# Patient Record
Sex: Female | Born: 1955 | Smoking: Never smoker
Health system: Southern US, Community
[De-identification: ages and names within clinical notes are randomized; demographics above are authoritative.]

## PROBLEM LIST (undated history)

## (undated) DIAGNOSIS — M40204 Unspecified kyphosis, thoracic region: Secondary | ICD-10-CM

## (undated) DIAGNOSIS — I10 Essential (primary) hypertension: Secondary | ICD-10-CM

## (undated) HISTORY — DX: Unspecified kyphosis, thoracic region: M40.204

## (undated) HISTORY — DX: Essential (primary) hypertension: I10

---
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
## 1993-08-12 HISTORY — PX: LUMBAR DISC SURGERY: SHX700

## 2020-05-01 ENCOUNTER — Other Ambulatory Visit
Admission: RE | Admit: 2020-05-01 | Discharge: 2020-05-01 | Disposition: A | Discharge status: Home / Self Care | Payer: Self-pay | Source: Ambulatory Visit | Attending: Internal Medicine | Admitting: Internal Medicine

## 2020-05-01 DIAGNOSIS — R002 Palpitations: Secondary | ICD-10-CM | POA: Insufficient documentation

## 2020-05-01 LAB — FIBRIN DERIVATIVES D-DIMER (ARMC ONLY): Fibrin derivatives D-dimer (ARMC): 610.28 ng/mL (FEU) — ABNORMAL HIGH (ref 0.00–499.00)

## 2020-05-01 LAB — BRAIN NATRIURETIC PEPTIDE: B Natriuretic Peptide: 53.2 pg/mL (ref 0.0–100.0)

## 2020-05-16 ENCOUNTER — Other Ambulatory Visit: Payer: Self-pay | Admitting: Internal Medicine

## 2020-05-16 DIAGNOSIS — Z1231 Encounter for screening mammogram for malignant neoplasm of breast: Secondary | ICD-10-CM

## 2020-05-19 ENCOUNTER — Other Ambulatory Visit: Payer: Self-pay | Admitting: Internal Medicine

## 2020-05-19 DIAGNOSIS — Z0001 Encounter for general adult medical examination with abnormal findings: Secondary | ICD-10-CM

## 2020-05-19 DIAGNOSIS — R937 Abnormal findings on diagnostic imaging of other parts of musculoskeletal system: Secondary | ICD-10-CM

## 2020-05-19 DIAGNOSIS — M5430 Sciatica, unspecified side: Secondary | ICD-10-CM

## 2020-05-19 DIAGNOSIS — R7989 Other specified abnormal findings of blood chemistry: Secondary | ICD-10-CM

## 2020-05-19 DIAGNOSIS — M549 Dorsalgia, unspecified: Secondary | ICD-10-CM

## 2020-05-23 ENCOUNTER — Other Ambulatory Visit: Payer: Self-pay

## 2020-05-23 ENCOUNTER — Ambulatory Visit
Admission: RE | Admit: 2020-05-23 | Discharge: 2020-05-23 | Disposition: A | Discharge status: Home / Self Care | Payer: BC Managed Care – PPO | Source: Ambulatory Visit | Attending: Internal Medicine | Admitting: Internal Medicine

## 2020-05-23 ENCOUNTER — Ambulatory Visit: Payer: BC Managed Care – PPO

## 2020-05-23 DIAGNOSIS — R7989 Other specified abnormal findings of blood chemistry: Secondary | ICD-10-CM

## 2020-05-23 DIAGNOSIS — M549 Dorsalgia, unspecified: Secondary | ICD-10-CM | POA: Diagnosis present

## 2020-05-23 DIAGNOSIS — M5430 Sciatica, unspecified side: Secondary | ICD-10-CM | POA: Insufficient documentation

## 2020-05-23 DIAGNOSIS — Z0001 Encounter for general adult medical examination with abnormal findings: Secondary | ICD-10-CM

## 2020-05-23 DIAGNOSIS — R937 Abnormal findings on diagnostic imaging of other parts of musculoskeletal system: Secondary | ICD-10-CM | POA: Insufficient documentation

## 2020-06-01 ENCOUNTER — Inpatient Hospital Stay: Payer: BC Managed Care – PPO

## 2020-06-01 ENCOUNTER — Other Ambulatory Visit: Payer: Self-pay

## 2020-06-01 ENCOUNTER — Encounter: Payer: Self-pay | Admitting: Oncology

## 2020-06-01 ENCOUNTER — Inpatient Hospital Stay: Payer: BC Managed Care – PPO | Attending: Oncology | Admitting: Oncology

## 2020-06-01 VITALS — BP 126/100 | HR 102 | Temp 97.9°F | Resp 16 | Wt 126.9 lb

## 2020-06-01 DIAGNOSIS — F1010 Alcohol abuse, uncomplicated: Secondary | ICD-10-CM | POA: Diagnosis not present

## 2020-06-01 DIAGNOSIS — K760 Fatty (change of) liver, not elsewhere classified: Secondary | ICD-10-CM | POA: Insufficient documentation

## 2020-06-01 DIAGNOSIS — R7989 Other specified abnormal findings of blood chemistry: Secondary | ICD-10-CM

## 2020-06-01 DIAGNOSIS — R5383 Other fatigue: Secondary | ICD-10-CM | POA: Diagnosis not present

## 2020-06-01 DIAGNOSIS — D7589 Other specified diseases of blood and blood-forming organs: Secondary | ICD-10-CM | POA: Diagnosis not present

## 2020-06-01 DIAGNOSIS — M48061 Spinal stenosis, lumbar region without neurogenic claudication: Secondary | ICD-10-CM | POA: Diagnosis not present

## 2020-06-01 DIAGNOSIS — R945 Abnormal results of liver function studies: Secondary | ICD-10-CM | POA: Diagnosis not present

## 2020-06-01 DIAGNOSIS — M2578 Osteophyte, vertebrae: Secondary | ICD-10-CM | POA: Insufficient documentation

## 2020-06-01 DIAGNOSIS — I10 Essential (primary) hypertension: Secondary | ICD-10-CM | POA: Diagnosis not present

## 2020-06-01 LAB — CBC WITH DIFFERENTIAL/PLATELET
Abs Immature Granulocytes: 0.01 10*3/uL (ref 0.00–0.07)
Basophils Absolute: 0 10*3/uL (ref 0.0–0.1)
Basophils Relative: 1 %
Eosinophils Absolute: 0 10*3/uL (ref 0.0–0.5)
Eosinophils Relative: 1 %
HCT: 33 % — ABNORMAL LOW (ref 36.0–46.0)
Hemoglobin: 12 g/dL (ref 12.0–15.0)
Immature Granulocytes: 0 %
Lymphocytes Relative: 14 %
Lymphs Abs: 0.9 10*3/uL (ref 0.7–4.0)
MCH: 35.9 pg — ABNORMAL HIGH (ref 26.0–34.0)
MCHC: 36.4 g/dL — ABNORMAL HIGH (ref 30.0–36.0)
MCV: 98.8 fL (ref 80.0–100.0)
Monocytes Absolute: 0.6 10*3/uL (ref 0.1–1.0)
Monocytes Relative: 9 %
Neutro Abs: 4.7 10*3/uL (ref 1.7–7.7)
Neutrophils Relative %: 75 %
Platelets: 458 10*3/uL — ABNORMAL HIGH (ref 150–400)
RBC: 3.34 MIL/uL — ABNORMAL LOW (ref 3.87–5.11)
RDW: 12.1 % (ref 11.5–15.5)
WBC: 6.2 10*3/uL (ref 4.0–10.5)
nRBC: 0 % (ref 0.0–0.2)

## 2020-06-01 LAB — IRON AND TIBC
Iron: 94 ug/dL (ref 28–170)
Saturation Ratios: 44 % — ABNORMAL HIGH (ref 10.4–31.8)
TIBC: 214 ug/dL — ABNORMAL LOW (ref 250–450)
UIBC: 120 ug/dL

## 2020-06-01 LAB — COMPREHENSIVE METABOLIC PANEL
ALT: 35 U/L (ref 0–44)
AST: 36 U/L (ref 15–41)
Albumin: 3.5 g/dL (ref 3.5–5.0)
Alkaline Phosphatase: 101 U/L (ref 38–126)
Anion gap: 11 (ref 5–15)
BUN: 10 mg/dL (ref 8–23)
CO2: 26 mmol/L (ref 22–32)
Calcium: 9.2 mg/dL (ref 8.9–10.3)
Chloride: 101 mmol/L (ref 98–111)
Creatinine, Ser: 0.51 mg/dL (ref 0.44–1.00)
GFR, Estimated: >60 mL/min (ref >60)
Glucose, Bld: 105 mg/dL — ABNORMAL HIGH (ref 70–99)
Potassium: 3 mmol/L — ABNORMAL LOW (ref 3.5–5.1)
Sodium: 138 mmol/L (ref 135–145)
Total Bilirubin: 1 mg/dL (ref 0.3–1.2)
Total Protein: 6.8 g/dL (ref 6.5–8.1)

## 2020-06-01 LAB — TSH: TSH: 1.896 u[IU]/mL (ref 0.350–4.500)

## 2020-06-01 LAB — FOLATE: Folate: 5.8 ng/mL — ABNORMAL LOW (ref >5.9)

## 2020-06-01 LAB — FERRITIN: Ferritin: 1189 ng/mL — ABNORMAL HIGH (ref 11–307)

## 2020-06-01 LAB — VITAMIN B12: Vitamin B-12: 293 pg/mL (ref 180–914)

## 2020-06-05 ENCOUNTER — Encounter: Payer: Self-pay | Admitting: Oncology

## 2020-06-05 NOTE — Progress Notes (Signed)
Hematology/Oncology Consult note [facility] Telephone:([phone] [phone]  Patient Care Team: Gracelyn Nurse, MD as PCP - General (Internal Medicine)   Name of the patient: Leah Pratt  [id number]  1955-10-05    Reason for referral-elevated ferritin   Referring physician-Dr. Letitia Libra  Date of visit: 06/05/20   History of presenting illness- Patient is a 64 year old female with a past medical history significant for hypertension who has been referred for elevated ferritin.  Most recent CBC on 05/16/2020 showed white count of 5.3, H&H of 13.2/38.2 with an MCV of 104.  Iron studies showed elevated ferritin of 1468 and iron saturation of 57%.  She was also noted to have an elevated AST and ALT of 122/112 which was coming down from 330 and 168 respectively and bilirubin had come down from 1.9-1.1.  Patient did have a history of significant alcohol intake about 2 to 3 glasses of wine per day but states that she  has now stopped drinking over the last 4 weeks.  Denies any family history of any liver disorders.  Patient currently reports feeling fatigued.  She has lost about 10 pounds over the last 4 months.  ECOG PS- 2  Pain scale- 0   Review of systems- Review of Systems  Constitutional: Positive for malaise/fatigue and weight loss. Negative for chills and fever.  HENT: Negative for congestion, ear discharge and nosebleeds.   Eyes: Negative for blurred vision.  Respiratory: Negative for cough, hemoptysis, sputum production, shortness of breath and wheezing.   Cardiovascular: Negative for chest pain, palpitations, orthopnea and claudication.  Gastrointestinal: Negative for abdominal pain, blood in stool, constipation, diarrhea, heartburn, melena, nausea and vomiting.  Genitourinary: Negative for dysuria, flank pain, frequency, hematuria and urgency.  Musculoskeletal: Negative for back pain, joint pain and myalgias.  Skin: Negative for rash.    Neurological: Negative for dizziness, tingling, focal weakness, seizures, weakness and headaches.  Endo/Heme/Allergies: Does not bruise/bleed easily.  Psychiatric/Behavioral: Negative for depression and suicidal ideas. The patient does not have insomnia.     No Known Allergies  There are no problems to display for this patient.    Past Medical History:  Diagnosis Date  . Hypertension   . Kyphosis of thoracic region    L4 /L5 unspecified kyphosis type     Past Surgical History:  Procedure Laterality Date  . APPENDECTOMY  1966  . LUMBAR DISC SURGERY  1995   L4-L5    Social History   Socioeconomic History  . Marital status: Unknown    Spouse name: Not on file  . Number of children: Not on file  . Years of education: Not on file  . Highest education level: Not on file  Occupational History  . Not on file  Tobacco Use  . Smoking status: Never Smoker  . Smokeless tobacco: Never Used  Substance and Sexual Activity  . Alcohol use: Never  . Drug use: Never  . Sexual activity: Not Currently  Other Topics Concern  . Not on file  Social History Narrative  . Not on file   Social Determinants of Health   Financial Resource Strain:   . Difficulty of Paying Living Expenses: Not on file  Food Insecurity:   . Worried About Programme researcher, broadcasting/film/video in the Last Year: Not on file  . Ran Out of Food in the Last Year: Not on file  Transportation Needs:   . Lack of Transportation (Medical): Not on file  . Lack of Transportation (Non-Medical): Not  on file  Physical Activity:   . Days of Exercise per Week: Not on file  . Minutes of Exercise per Session: Not on file  Stress:   . Feeling of Stress : Not on file  Social Connections:   . Frequency of Communication with Friends and Family: Not on file  . Frequency of Social Gatherings with Friends and Family: Not on file  . Attends Religious Services: Not on file  . Active Member of Clubs or Organizations: Not on file  . Attends  Banker Meetings: Not on file  . Marital Status: Not on file  Intimate Partner Violence:   . Fear of Current or Ex-Partner: Not on file  . Emotionally Abused: Not on file  . Physically Abused: Not on file  . Sexually Abused: Not on file     History reviewed. No pertinent family history.   Current Outpatient Medications:  .  traMADol (ULTRAM) 50 MG tablet, Take 50 mg by mouth every 6 (six) hours as needed., Disp: , Rfl:  .  traZODone (DESYREL) 50 MG tablet, , Disp: , Rfl:    Physical exam:  Vitals:   06/01/20 1106  BP: (!) 126/100  Pulse: (!) 102  Resp: 16  Temp: 97.9 F (36.6 C)  TempSrc: Tympanic  SpO2: 100%  Weight: 126 lb 14.4 oz (57.6 kg)   Physical Exam Constitutional:      Comments: Sitting in a wheelchair.  Appears in no acute distress  Cardiovascular:     Rate and Rhythm: Normal rate and regular rhythm.     Heart sounds: Normal heart sounds.  Pulmonary:     Effort: Pulmonary effort is normal.     Breath sounds: Normal breath sounds.  Abdominal:     General: Bowel sounds are normal. There is no distension.     Palpations: Abdomen is soft.     Tenderness: There is no abdominal tenderness.     Comments: No palpable hepatosplenomegaly  Skin:    General: Skin is warm and dry.  Neurological:     Mental Status: She is alert and oriented to person, place, and time.        CMP Latest Ref Rng & Units 06/01/2020  Glucose 70 - 99 mg/dL 220(U)  BUN 8 - 23 mg/dL 10  Creatinine 5.42 - 7.06 mg/dL 2.37  Sodium 628 - 315 mmol/L 138  Potassium 3.5 - 5.1 mmol/L 3.0(L)  Chloride 98 - 111 mmol/L 101  CO2 22 - 32 mmol/L 26  Calcium 8.9 - 10.3 mg/dL 9.2  Total Protein 6.5 - 8.1 g/dL 6.8  Total Bilirubin 0.3 - 1.2 mg/dL 1.0  Alkaline Phos 38 - 126 U/L 101  AST 15 - 41 U/L 36  ALT 0 - 44 U/L 35   CBC Latest Ref Rng & Units 06/01/2020  WBC 4.0 - 10.5 K/uL 6.2  Hemoglobin 12.0 - 15.0 g/dL 17.6  Hematocrit 36 - 46 % 33.0(L)  Platelets 150 - 400 K/uL  458(H)    No images are attached to the encounter.  MR LUMBAR SPINE WO CONTRAST  Result Date: 05/23/2020 CLINICAL DATA:  64 year old female with recent bilateral foot and anterior abdominal numbness. Low back pain. Two prior surgeries. EXAM: MRI LUMBAR SPINE WITHOUT CONTRAST TECHNIQUE: Multiplanar, multisequence MR imaging of the lumbar spine was performed. No intravenous contrast was administered. COMPARISON:  Report of lumbar radiographs 05/16/2020 (no images available). 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 Alignment:  Straightening of  lumbar lordosis. Vertebrae: Chronic degenerative endplate marrow signal changes in the lower lumbar spine. No convincing marrow edema or acute osseous abnormality identified. Background bone marrow signal within normal limits. Intact visible sacrum and SI joints. Conus medullaris and cauda equina: Conus extends to the T11-T12 level and is faintly visible. Capacious lower thoracic and upper lumbar spinal canal. Paraspinal and other soft tissues: Negative. Disc levels: T11-T12: Negative. T12-L1:  Negative. L1-L2:  Negative. L2-L3: Disc desiccation and disc space loss. Circumferential disc bulge with small superimposed left paracentral disc protrusion. Mild to moderate facet hypertrophy. Borderline to mild spinal stenosis. No lateral recess or foraminal stenosis. L3-L4: Moderate to severe disc space loss with circumferential disc osteophyte complex. Broad-based posterior and left greater than right foraminal involvement (series 8, image 24). Mild to moderate facet hypertrophy. Mild spinal stenosis. Mild if any lateral recess stenosis. Moderate left L3 foraminal stenosis. L4-L5: Moderate to severe disc space loss with circumferential disc osteophyte complex. Mild posterior element hypertrophy, probably with remote laminectomy changes on the right. No spinal stenosis. Mild right lateral recess stenosis (right L5  nerve level). Mild bilateral L4 foraminal stenosis. L5-S1: Moderate disc space loss. Circumferential disc osteophyte complex, primarily with the right far lateral component (series 8, image 33). Mild posterior element hypertrophy with possible previous laminectomy. No significant spinal or lateral recess stenosis, epidural lipomatosis begins at this level. Moderate right L5 foraminal stenosis. No significant left foraminal stenosis. IMPRESSION: 1. 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 Remote postoperative changes suspected at the L4-L5 and L5-S1 lamina. 2. Mild spinal stenosis at L2-L3 and L3-L4. Mild right lateral recess stenosis at L4-L5. Moderate neural foraminal stenosis at the left L3 and right L5 nerve levels. Electronically Signed   By: Odessa Fleming M.D.   On: 05/23/2020 19:53   US Abdomen Limited RUQ  Result Date: 05/23/2020 CLINICAL DATA:  64 year old female with elevated LFTs. EXAM: ULTRASOUND ABDOMEN LIMITED RIGHT UPPER QUADRANT COMPARISON:  None. FINDINGS: Gallbladder: No echogenic sludge or stones. A small 6 mm polyp of the gallbladder wall is suspected on image 6 (benign and inconsequential as per ACR consensus guidelines: White Paper of the ACR Incidental Findings Committee II on Gallbladder and Biliary Findings. J Am Coll Radiol 2013:;10:953-956). Normal gallbladder wall thickness elsewhere. No pericholecystic fluid. Common bile duct: Diameter: 4 mm, normal. Liver: Echogenic liver (image 38). Mildly coarse hepatic echotexture. No discrete liver lesion. No intrahepatic biliary ductal dilatation. Portal vein is patent on color Doppler imaging with normal direction of blood flow towards the liver. Other: Negative visible right kidney. IMPRESSION: 1. Positive for Hepatic Steatosis. 2. Negative gallbladder.  No evidence of bile duct obstruction. Electronically Signed   By: Odessa Fleming M.D.   On: 05/23/2020 19:56    Assessment and plan- Patient is a 64 y.o. female referred for  elevated ferritin  Suspect elevated ferritin due to recent alcohol intake.  Today I will repeat CBC with differential CMP Ferritin and iron studies B12 TSH folate as well as hemochromatosis testing.  I will see her in about 2 weeks time for a video visit.  I will also refer her to GI for abnormal LFTs.  If she does not have any evidence of hemochromatosis and ferritin levels remain high along with abnormal LFTs liver biopsy could be considered.  I will also obtain MRI of her liver looking for iron overload   Thank you for this kind referral and the opportunity to participate in the care of this patient   Visit  Diagnosis 1. Elevated ferritin   2. Abnormal LFTs   3. Macrocytosis     Dr. Owens SharkArchana Stanislaw Acton, MD, MPH [facility] [phone] 06/05/2020  2:53 PM

## 2020-06-06 ENCOUNTER — Encounter: Payer: Self-pay | Admitting: Oncology

## 2020-06-07 LAB — HEMOCHROMATOSIS DNA-PCR(C282Y,H63D)

## 2020-06-16 ENCOUNTER — Telehealth: Payer: Self-pay | Admitting: *Deleted

## 2020-06-16 ENCOUNTER — Telehealth: Payer: Self-pay | Admitting: Gastroenterology

## 2020-06-16 NOTE — Telephone Encounter (Signed)
Patient called directly to me and let me know that she has transferred her care to [facility].  She has [facility] and [facility] combined together on my chart.  But because when she came here where [facility] my chart and she does not have access to that.  Since she is changing her care to [facility] and already has an appointment there she would like [place] to send her lab work and the one progress note from Dr. Smith Robert that has been done.  I have verified that this was the exact phone number that she was calling from that meets the guidelines in the chart and her date of birth.  I asked her if I could mail it or printed for her and she preferred to have it sent by email.  I told her that I could send through secure email and wanted to make sure on her side that she felt like it was secure to send it to the email given to me which is [email or web address]. she states it is a Animal nutritionist. I have sent it to her as she requested and we will cancel her next appt due to her changing her care to [facility].

## 2020-06-16 NOTE — Telephone Encounter (Signed)
Patient called and will beTranferring care over to [facility]. Referral is Closed.

## 2020-06-18 ENCOUNTER — Ambulatory Visit: Payer: BC Managed Care – PPO

## 2020-06-20 ENCOUNTER — Inpatient Hospital Stay: Payer: BC Managed Care – PPO | Admitting: Oncology

## 2020-07-17 ENCOUNTER — Ambulatory Visit: Payer: BC Managed Care – PPO | Admitting: Gastroenterology

## 2021-06-16 IMAGING — MR MR LUMBAR SPINE W/O CM
5 series · 30 of 48 positions shown · non-contrast
Comparison: Report of lumbar radiographs 05/16/2020 (no images
available).

CLINICAL DATA: 63-year-old female with recent bilateral foot and
anterior abdominal numbness. Low back pain. Two prior surgeries.

EXAM:
MRI LUMBAR SPINE WITHOUT CONTRAST
TECHNIQUE: Multiplanar, multisequence MR imaging of the lumbar spine was
performed. No intravenous contrast was administered.

[Series 5: T2 · sagittal · 4.0mm · 0.81mm/px · 6 of 17 slices shown (1 of 2)]
[im 1/17]
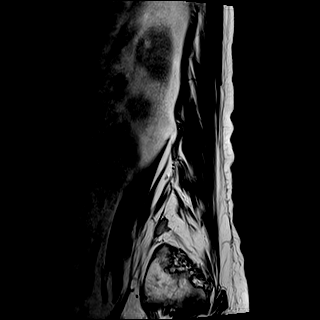
[im 4/17]
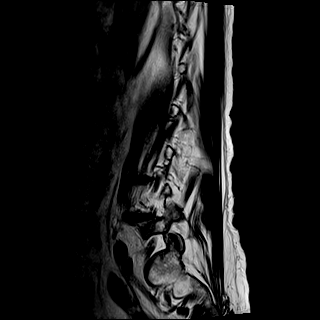
[im 7/17]
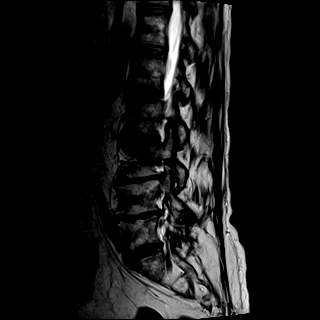
[im 10/17]
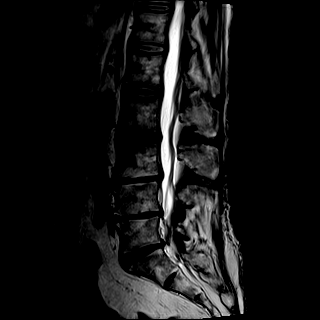
[im 13/17]
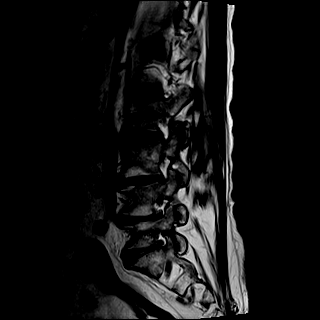
[im 17/17]
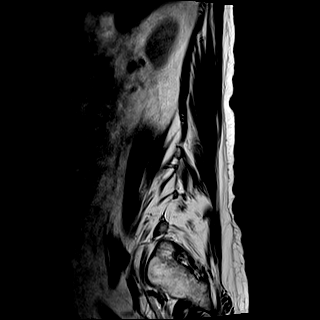

[Series 6: T1 · sagittal · 4.0mm · 0.81mm/px · 7 of 17 slices shown (1 of 2)]
[im 1/17]
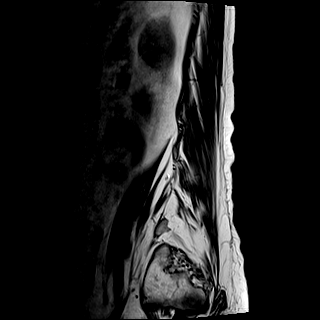
[im 3/17]
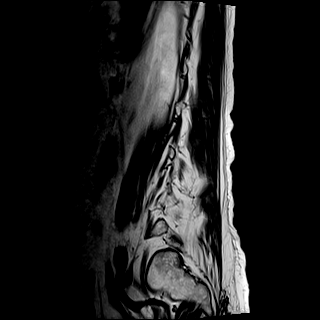
[im 6/17]
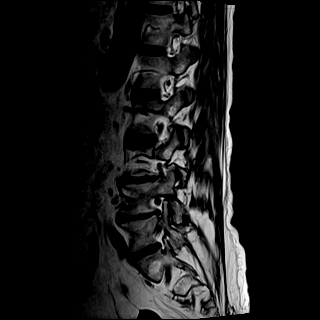
[im 9/17]
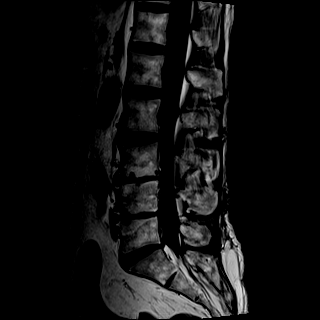
[im 11/17]
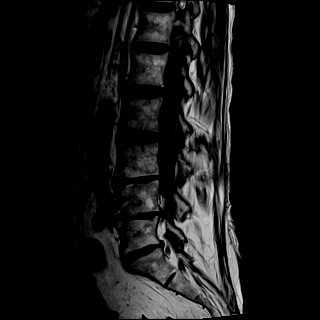
[im 14/17]
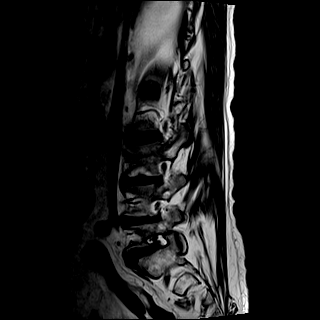
[im 17/17]
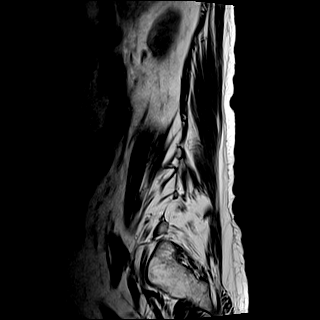

[Series 7: STIR · sagittal · 4.0mm · 0.41mm/px · 1 of 17 slices shown]
[im 1/17]
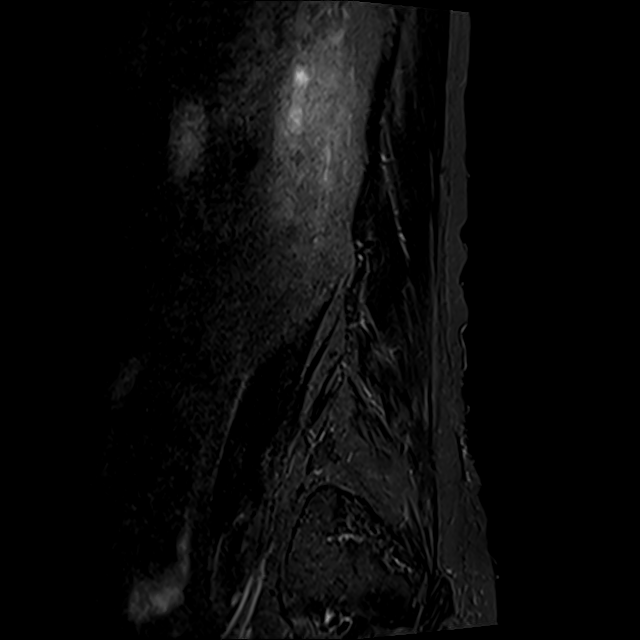

[Series 8: T2 · axial · 4.0mm · 0.78mm/px · z∈[-108,+101]mm · 8 of 37 slices shown (2 of 2)]
[im 1/37]
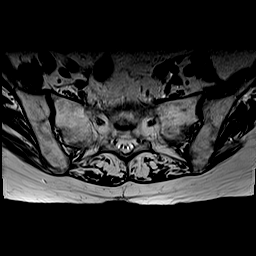
[im 6/37]
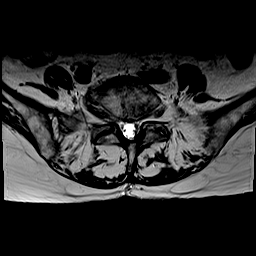
[im 12/37]
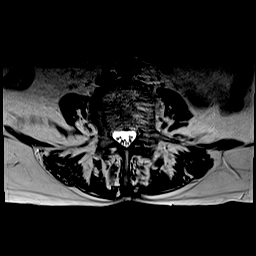
[im 17/37]
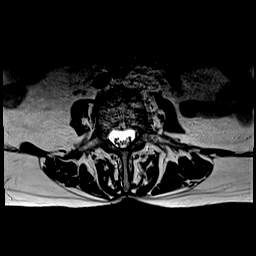
[im 20/37]
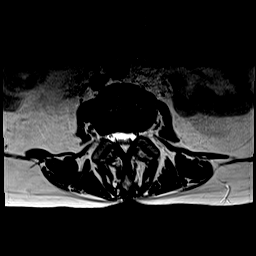
[im 25/37]
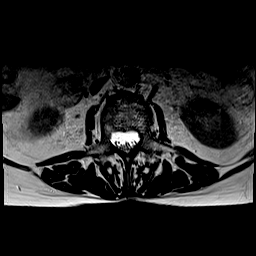
[im 31/37]
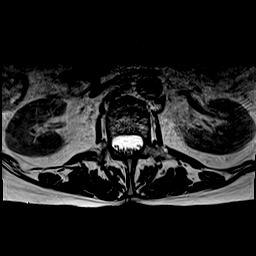
[im 37/37]
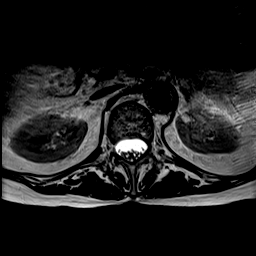

[Series 9: T1 · axial · 4.0mm · 0.39mm/px · z∈[-108,+101]mm · 8 of 37 slices shown (2 of 2)]
[im 1/37]
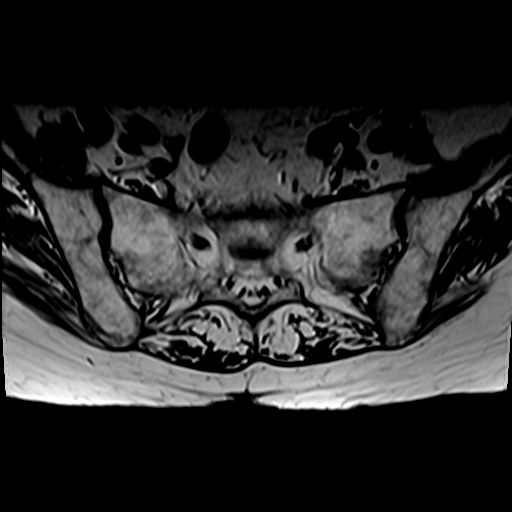
[im 6/37]
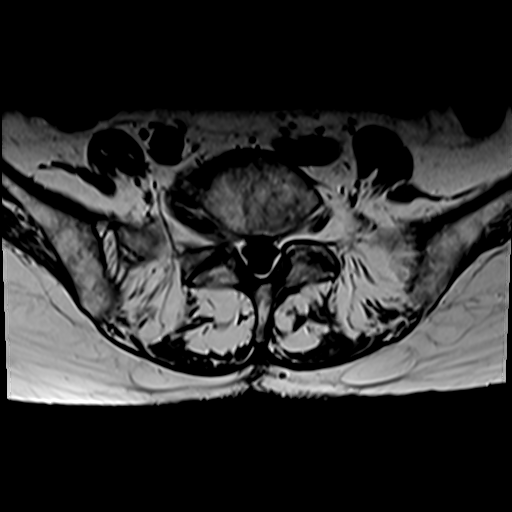
[im 12/37]
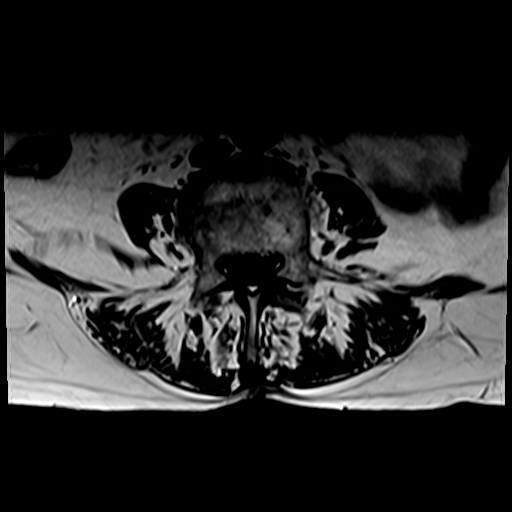
[im 17/37]
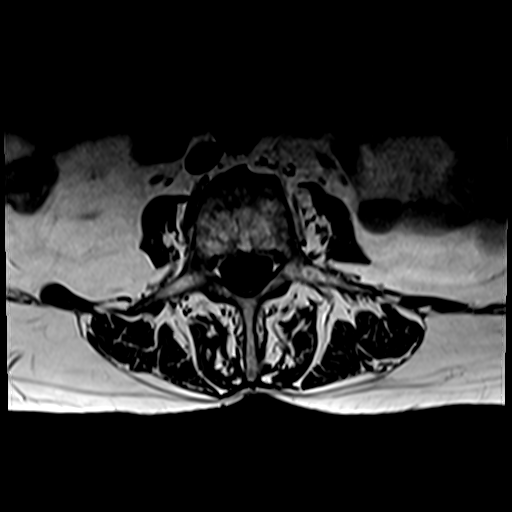
[im 20/37]
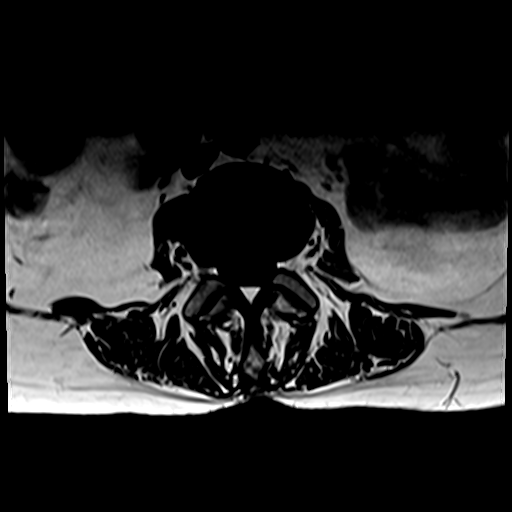
[im 25/37]
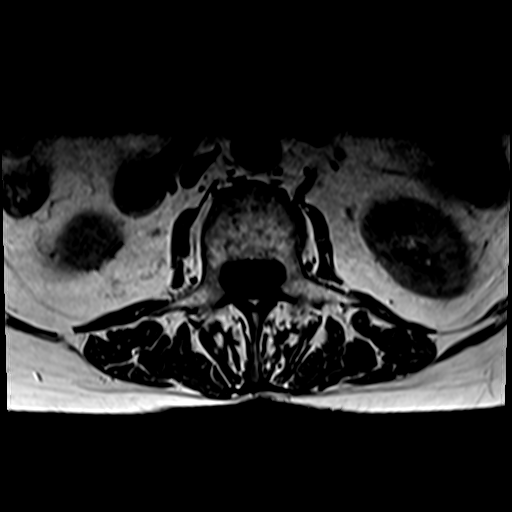
[im 31/37]
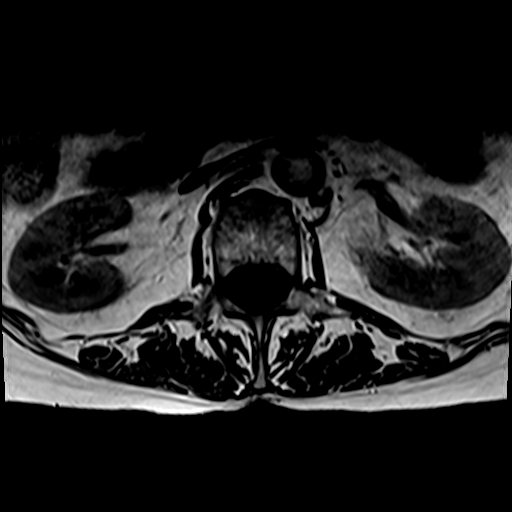
[im 37/37]
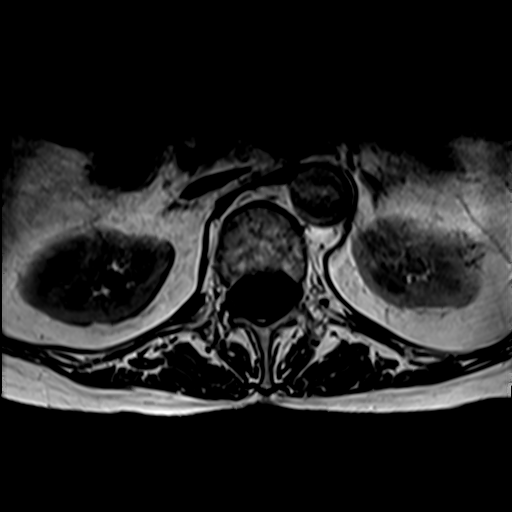

[30 of 48 positions shown; findings below may reference images not displayed]

FINDINGS: Segmentation: Lumbar segmentation appears to be normal, with
suspected hypoplastic ribs at T12.

Alignment:  Straightening of lumbar lordosis.

Vertebrae: Chronic degenerative endplate marrow signal changes in
the lower lumbar spine. No convincing marrow edema or acute osseous
abnormality identified. Background bone marrow signal within normal
limits. Intact visible sacrum and SI joints.

Conus medullaris and cauda equina: Conus extends to the T11-T12
level and is faintly visible. Capacious lower thoracic and upper
lumbar spinal canal.

Paraspinal and other soft tissues: Negative.

Disc levels:

T11-T12: Negative.

T12-L1:  Negative.

L1-L2:  Negative.

L2-L3: Disc desiccation and disc space loss. Circumferential disc
bulge with small superimposed left paracentral disc protrusion. Mild
to moderate facet hypertrophy. Borderline to mild spinal stenosis.
No lateral recess or foraminal stenosis.

L3-L4: Moderate to severe disc space loss with circumferential disc
osteophyte complex. Broad-based posterior and left greater than
right foraminal involvement (series 8, image 24). Mild to moderate
facet hypertrophy.

Mild spinal stenosis. Mild if any lateral recess stenosis. Moderate
left L3 foraminal stenosis.

L4-L5: Moderate to severe disc space loss with circumferential disc
osteophyte complex. Mild posterior element hypertrophy, probably
with remote laminectomy changes on the right. No spinal stenosis.
Mild right lateral recess stenosis (right L5 nerve level). Mild
bilateral L4 foraminal stenosis.

L5-S1: Moderate disc space loss. Circumferential disc osteophyte
complex, primarily with the right far lateral component (series 8,
image 33). Mild posterior element hypertrophy with possible previous
laminectomy. No significant spinal or lateral recess stenosis,
epidural lipomatosis begins at this level. Moderate right L5
foraminal stenosis. No significant left foraminal stenosis.
IMPRESSION: 1. Advanced chronic disc and endplate degeneration L3-L4 through
L5-S1. Remote postoperative changes suspected at the L4-L5 and L5-S1
lamina.

2. Mild spinal stenosis at L2-L3 and L3-L4. Mild right lateral
recess stenosis at L4-L5. Moderate neural foraminal stenosis at the
left L3 and right L5 nerve levels.
